# Patient Record
Sex: Female | Born: 1935 | Hispanic: No | Smoking: Never smoker
Health system: Southern US, Community
[De-identification: ages and names within clinical notes are randomized; demographics above are authoritative.]

## PROBLEM LIST (undated history)

## (undated) DIAGNOSIS — I10 Essential (primary) hypertension: Secondary | ICD-10-CM

## (undated) DIAGNOSIS — E785 Hyperlipidemia, unspecified: Secondary | ICD-10-CM

## (undated) HISTORY — PX: CHOLECYSTECTOMY: SHX55

---

## 2009-01-31 ENCOUNTER — Emergency Department (HOSPITAL_COMMUNITY): Admission: EM | Admit: 2009-01-31 | Discharge: 2009-01-31 | Payer: Self-pay | Admitting: Emergency Medicine

## 2009-02-26 ENCOUNTER — Emergency Department (HOSPITAL_COMMUNITY): Admission: EM | Admit: 2009-02-26 | Discharge: 2009-02-26 | Payer: Self-pay | Admitting: Emergency Medicine

## 2009-12-20 IMAGING — CR DG SHOULDER 2+V*R*
3 series · 3 of 3 positions shown · non-contrast
Comparison: None

CLINICAL DATA: Fell in shower this morning with right shoulder pain

RIGHT SHOULDER - 2+ VIEW

[t shoulder y view right *]
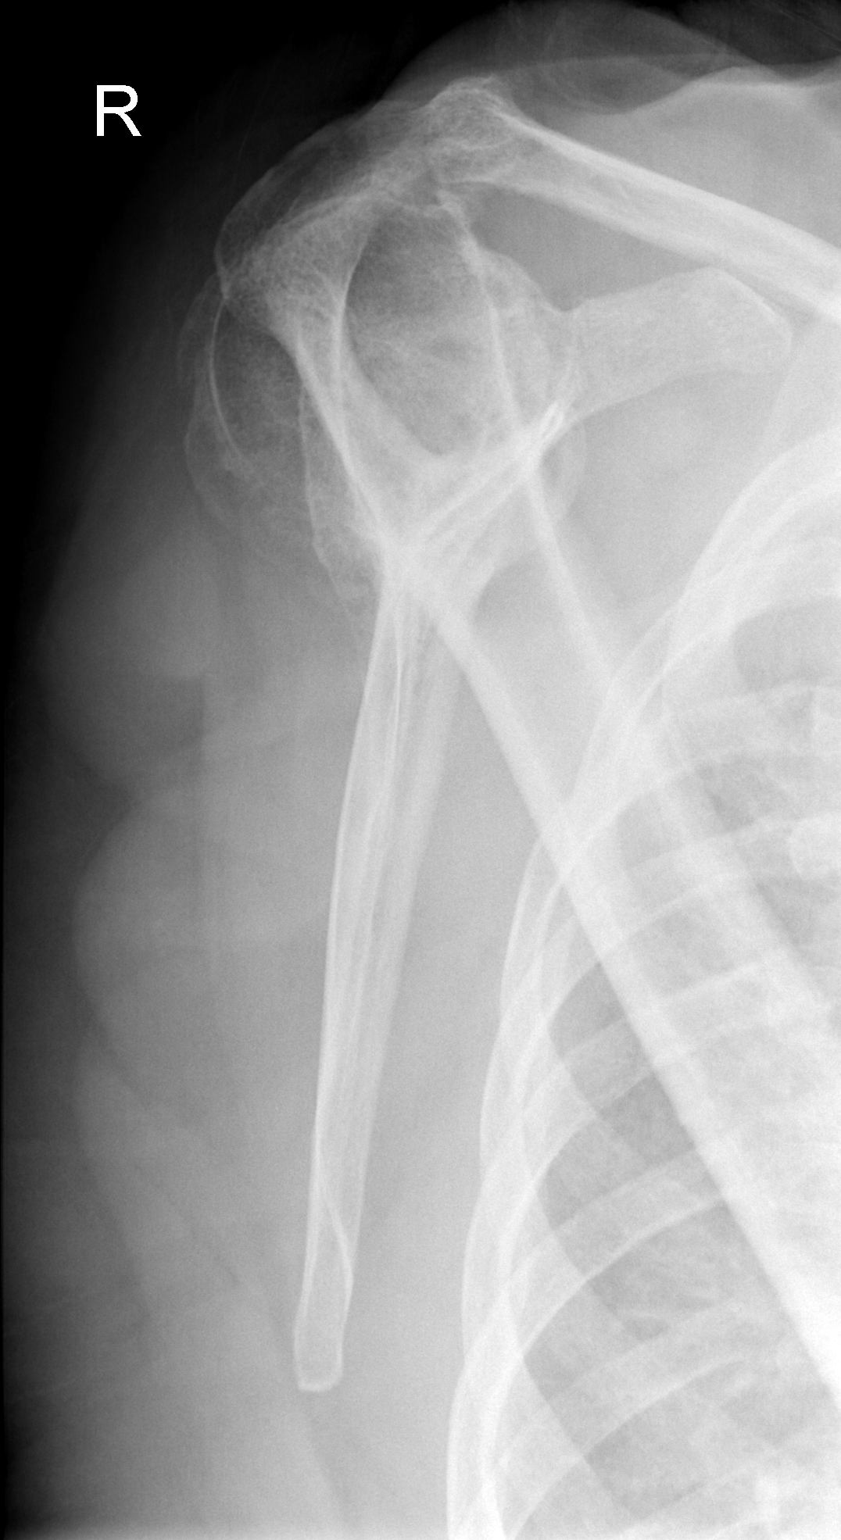

[t shoulder ap internal righ *]
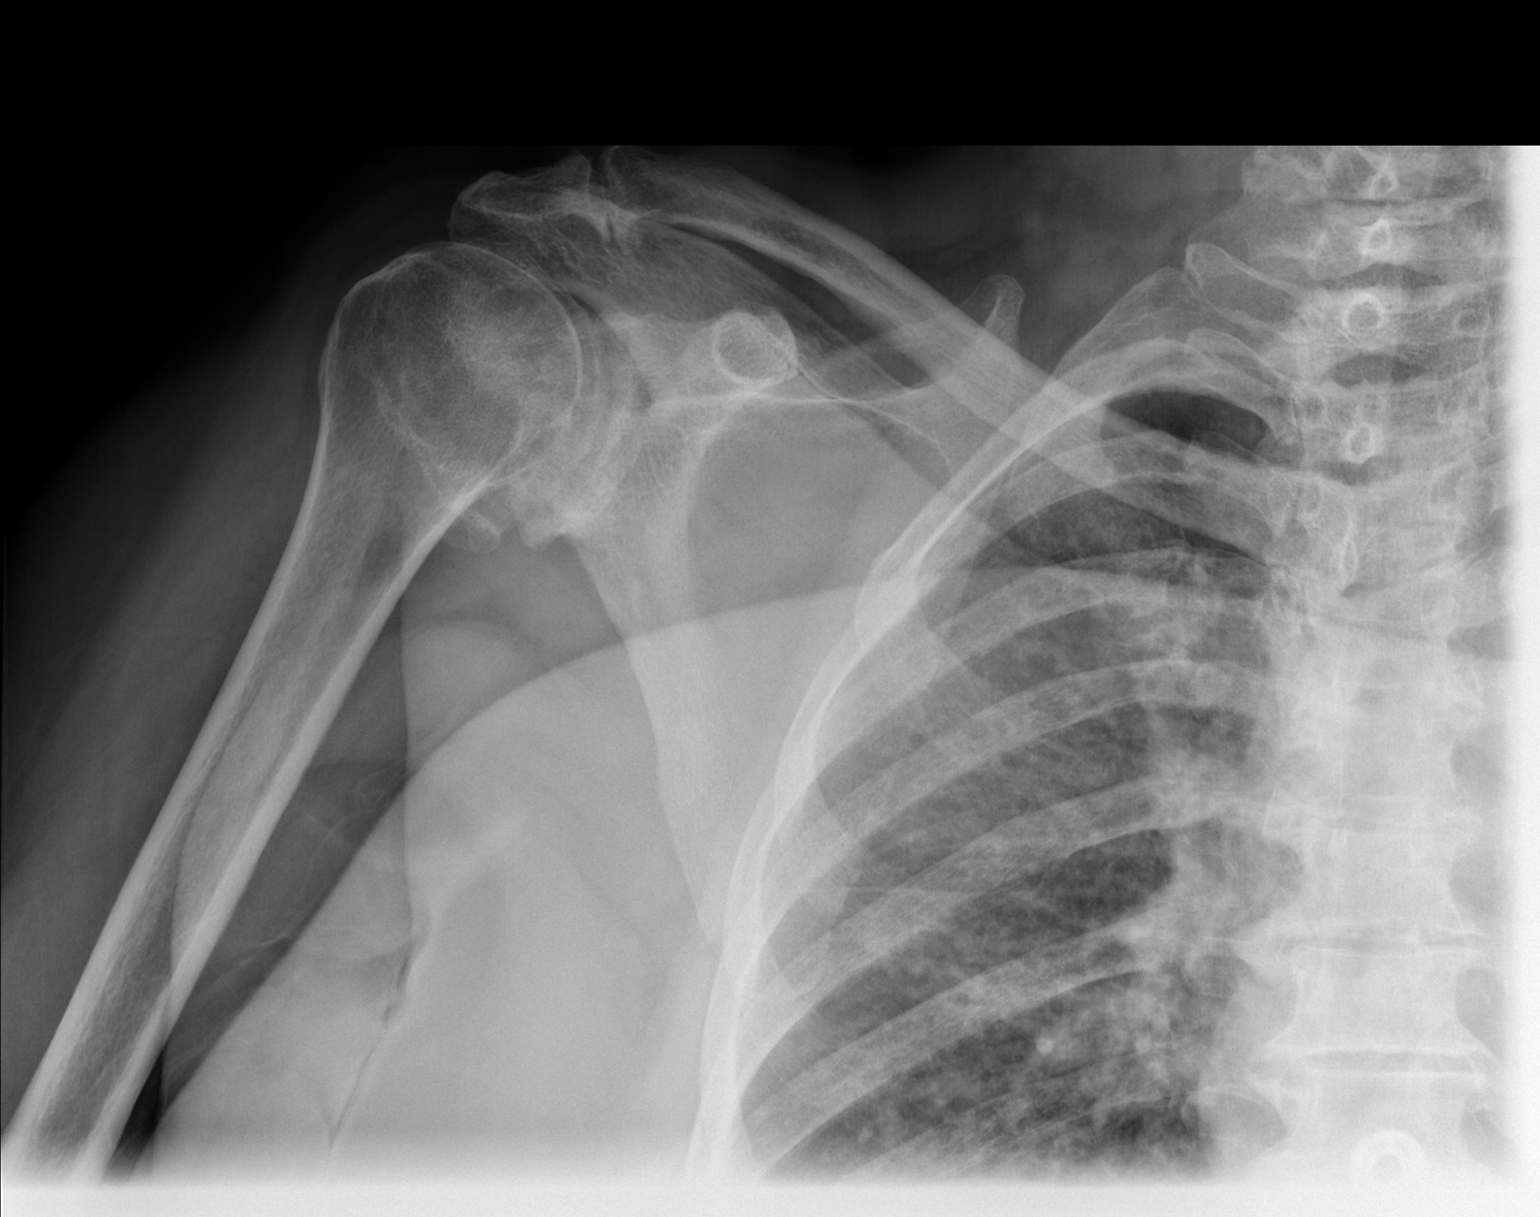

[t shoulder ap external righ]
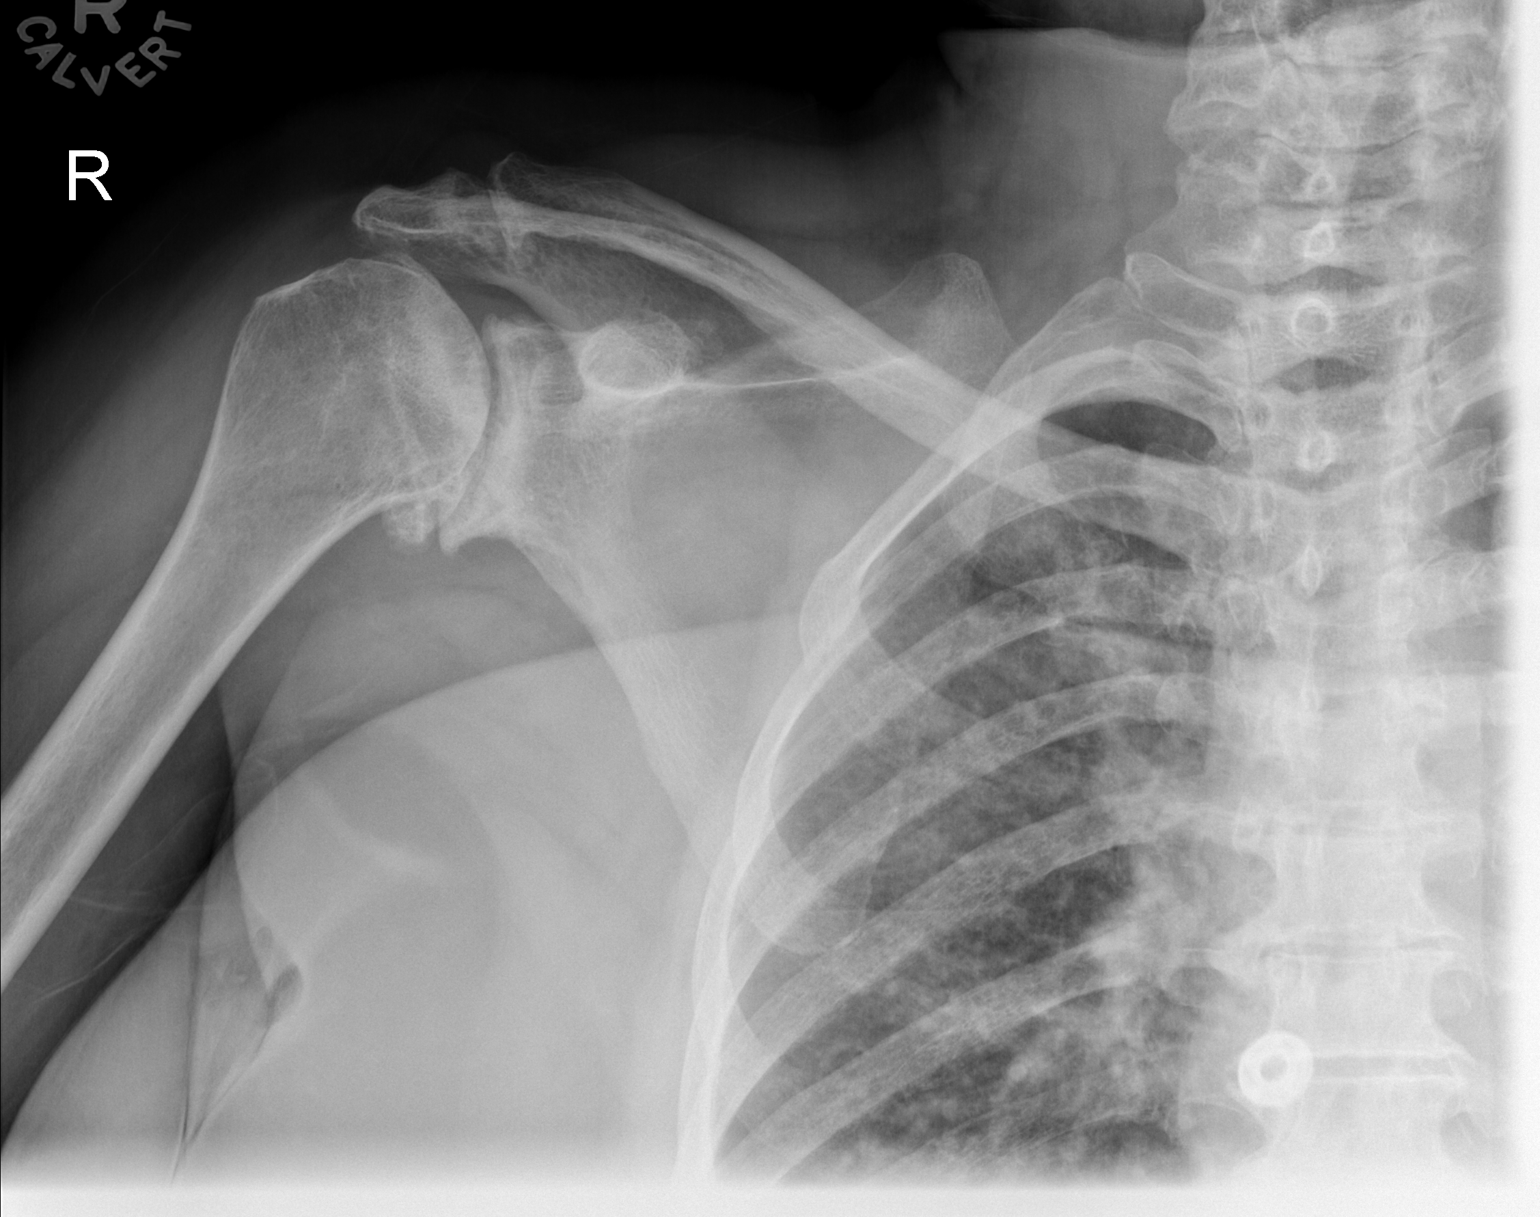

[3 of 3 positions shown; findings below may reference images not displayed]

FINDINGS: There is considerable degenerative change involving the
right glenohumeral joint space with loss of joint space, sclerosis,
and spurring.  No fracture is seen.  No dislocation is noted.  Mild
degenerative change involves the right AC joint.
IMPRESSION: Degenerative change primarily involving the right glenohumeral
joint.  No acute fracture.

## 2010-11-04 LAB — SEDIMENTATION RATE: Sed Rate: 10 mm/hr (ref 0–22)

## 2010-11-04 LAB — COMPREHENSIVE METABOLIC PANEL
ALT: 15 U/L (ref 0–35)
BUN: 10 mg/dL (ref 6–23)
CO2: 24 mEq/L (ref 19–32)
Calcium: 8.9 mg/dL (ref 8.4–10.5)
Creatinine, Ser: 0.77 mg/dL (ref 0.4–1.2)
GFR calc non Af Amer: >60 mL/min (ref >60)
Glucose, Bld: 102 mg/dL — ABNORMAL HIGH (ref 70–99)
Sodium: 140 mEq/L (ref 135–145)
Total Protein: 6.5 g/dL (ref 6.0–8.3)

## 2010-11-04 LAB — DIFFERENTIAL
Eosinophils Absolute: 0.1 10*3/uL (ref 0.0–0.7)
Lymphocytes Relative: 20 % (ref 12–46)
Lymphs Abs: 1.3 10*3/uL (ref 0.7–4.0)
Monocytes Relative: 6 % (ref 3–12)
Neutro Abs: 4.6 10*3/uL (ref 1.7–7.7)
Neutrophils Relative %: 73 % (ref 43–77)

## 2010-11-04 LAB — CBC
Hemoglobin: 14.1 g/dL (ref 12.0–15.0)
MCHC: 33.3 g/dL (ref 30.0–36.0)
MCV: 86.3 fL (ref 78.0–100.0)
RBC: 4.89 MIL/uL (ref 3.87–5.11)
RDW: 14.1 % (ref 11.5–15.5)

## 2013-09-18 ENCOUNTER — Encounter (HOSPITAL_COMMUNITY): Payer: Self-pay | Admitting: Emergency Medicine

## 2013-09-18 ENCOUNTER — Emergency Department (HOSPITAL_COMMUNITY)
Admission: EM | Admit: 2013-09-18 | Discharge: 2013-09-18 | Disposition: A | Discharge status: Home / Self Care | Payer: Self-pay | Attending: Emergency Medicine | Admitting: Emergency Medicine

## 2013-09-18 ENCOUNTER — Emergency Department (HOSPITAL_COMMUNITY): Payer: Self-pay

## 2013-09-18 DIAGNOSIS — J111 Influenza due to unidentified influenza virus with other respiratory manifestations: Secondary | ICD-10-CM

## 2013-09-18 DIAGNOSIS — R69 Illness, unspecified: Secondary | ICD-10-CM

## 2013-09-18 DIAGNOSIS — Z79899 Other long term (current) drug therapy: Secondary | ICD-10-CM | POA: Insufficient documentation

## 2013-09-18 DIAGNOSIS — I10 Essential (primary) hypertension: Secondary | ICD-10-CM | POA: Insufficient documentation

## 2013-09-18 DIAGNOSIS — E669 Obesity, unspecified: Secondary | ICD-10-CM | POA: Insufficient documentation

## 2013-09-18 DIAGNOSIS — Z7982 Long term (current) use of aspirin: Secondary | ICD-10-CM | POA: Insufficient documentation

## 2013-09-18 HISTORY — DX: Essential (primary) hypertension: I10

## 2013-09-18 HISTORY — DX: Hyperlipidemia, unspecified: E78.5

## 2013-09-18 LAB — I-STAT TROPONIN, ED: Troponin i, poc: 0.01 ng/mL (ref 0.00–0.08)

## 2013-09-18 LAB — BASIC METABOLIC PANEL
BUN: 13 mg/dL (ref 6–23)
CALCIUM: 9.4 mg/dL (ref 8.4–10.5)
CO2: 22 meq/L (ref 19–32)
CREATININE: 0.98 mg/dL (ref 0.50–1.10)
Chloride: 104 mEq/L (ref 96–112)
GFR calc Af Amer: 64 mL/min — ABNORMAL LOW (ref >90)
GFR calc non Af Amer: 55 mL/min — ABNORMAL LOW (ref >90)
Glucose, Bld: 101 mg/dL — ABNORMAL HIGH (ref 70–99)
Potassium: 3.9 mEq/L (ref 3.7–5.3)
Sodium: 139 mEq/L (ref 137–147)

## 2013-09-18 LAB — CBC WITH DIFFERENTIAL/PLATELET
BASOS ABS: 0 10*3/uL (ref 0.0–0.1)
BASOS PCT: 0 % (ref 0–1)
EOS PCT: 1 % (ref 0–5)
Eosinophils Absolute: 0.1 10*3/uL (ref 0.0–0.7)
HEMATOCRIT: 37.7 % (ref 36.0–46.0)
Hemoglobin: 12.6 g/dL (ref 12.0–15.0)
LYMPHS PCT: 10 % — AB (ref 12–46)
Lymphs Abs: 0.6 10*3/uL — ABNORMAL LOW (ref 0.7–4.0)
MCH: 28.2 pg (ref 26.0–34.0)
MCHC: 33.4 g/dL (ref 30.0–36.0)
MCV: 84.3 fL (ref 78.0–100.0)
MONO ABS: 0.7 10*3/uL (ref 0.1–1.0)
Monocytes Relative: 11 % (ref 3–12)
Neutro Abs: 5 10*3/uL (ref 1.7–7.7)
Neutrophils Relative %: 78 % — ABNORMAL HIGH (ref 43–77)
Platelets: 225 10*3/uL (ref 150–400)
RBC: 4.47 MIL/uL (ref 3.87–5.11)
RDW: 14.6 % (ref 11.5–15.5)
WBC: 6.4 10*3/uL (ref 4.0–10.5)

## 2013-09-18 LAB — RAPID STREP SCREEN (MED CTR MEBANE ONLY): STREPTOCOCCUS, GROUP A SCREEN (DIRECT): NEGATIVE

## 2013-09-18 MED ORDER — AEROCHAMBER PLUS FLO-VU MEDIUM MISC
1.0000 | Freq: Once | Status: AC
  Administered 2013-09-18: 1
  Filled 2013-09-18: qty 1

## 2013-09-18 MED ORDER — ALBUTEROL SULFATE HFA 108 (90 BASE) MCG/ACT IN AERS
2.0000 | INHALATION_SPRAY | Freq: Once | RESPIRATORY_TRACT | Status: AC
  Administered 2013-09-18: 2 via RESPIRATORY_TRACT
  Filled 2013-09-18: qty 6.7

## 2013-09-18 MED ORDER — OSELTAMIVIR PHOSPHATE 75 MG PO CAPS
75.0000 mg | ORAL_CAPSULE | Freq: Once | ORAL | Status: AC
  Administered 2013-09-18: 75 mg via ORAL
  Filled 2013-09-18: qty 1

## 2013-09-18 MED ORDER — IPRATROPIUM-ALBUTEROL 0.5-2.5 (3) MG/3ML IN SOLN
3.0000 mL | Freq: Once | RESPIRATORY_TRACT | Status: AC
Start: 2013-09-18 — End: 2013-09-18
  Administered 2013-09-18: 3 mL via RESPIRATORY_TRACT
  Filled 2013-09-18: qty 3

## 2013-09-18 MED ORDER — OSELTAMIVIR PHOSPHATE 75 MG PO CAPS: 75.0000 mg | ORAL_CAPSULE | Freq: Two times a day (BID) | ORAL | Status: AC

## 2013-09-18 NOTE — Discharge Instructions (Signed)
Influenza, Adult °Influenza (flu) is an infection in the mouth, nose, and throat (respiratory tract) caused by a virus. The flu can make you feel very ill. Influenza spreads easily from person to person (contagious).  °HOME CARE  °· Only take medicines as told by your doctor. °· Use a cool mist humidifier to make breathing easier. °· Get plenty of rest until your fever goes away. This usually takes 3 to 4 days. °· Drink enough fluids to keep your pee (urine) clear or pale yellow. °· Cover your mouth and nose when you cough or sneeze. °· Wash your hands well to avoid spreading the flu. °· Stay home from work or school until your fever has been gone for at least 1 full day. °· Get a flu shot every year. °GET HELP RIGHT AWAY IF:  °· You have trouble breathing or feel short of breath. °· Your skin or nails turn blue. °· You have severe neck pain or stiffness. °· You have a severe headache, facial pain, or earache. °· Your fever gets worse or keeps coming back. °· You feel sick to your stomach (nauseous), throw up (vomit), or have watery poop (diarrhea). °· You have chest pain. °· You have a deep cough that gets worse, or you cough up more thick spit (mucus). °MAKE SURE YOU:  °· Understand these instructions. °· Will watch your condition. °· Will get help right away if you are not doing well or get worse. °Document Released: 04/23/2008 Document Revised: 01/14/2012 Document Reviewed: 10/14/2011 °[employer]® Patient Information ©2014 [employer]. ° °

## 2013-09-18 NOTE — ED Provider Notes (Signed)
CSN: [id number]     Arrival date & time 09/18/13  1457 History   First MD Initiated Contact with Patient 09/18/13 1522     Chief Complaint  Patient presents with  . chest congestion   . Cough     (Consider location/radiation/quality/duration/timing/severity/associated sxs/prior Treatment) HPI 78 y.o. Female presents with complaints of cough and congestion.  She began having nasal congestion and sore throat yesterday with cough through the night.  The cough is nonproductive.  Denies fever or dyspnea.  She took nyquil and dayquil.  Last took otc meds 12 hour cough medicine and an antibiotic she brought from [place].  She did not have a flu shot this year.  She is here visiting her son from [place] and has been here for three months.  Her son has similar symptoms.  She is not a smoker and has never smoked.  She denies leg swelling, dvt, or history of pe.   Past Medical History  Diagnosis Date  . Hypertension   . Hyperlipidemia    Past Surgical History  Procedure Laterality Date  . Cholecystectomy     No family history on file. History  Substance Use Topics  . Smoking status: Never Smoker   . Smokeless tobacco: Not on file  . Alcohol Use: No   OB History   Grav Para Term Preterm Abortions TAB SAB Ect Mult Living                 Review of Systems  HENT: Positive for sneezing and sore throat.   Respiratory: Positive for cough.   Musculoskeletal:       H.o. Of frozen shoulder  All other systems reviewed and are negative.      Allergies  Review of patient's allergies indicates no known allergies.  Home Medications   Current Outpatient Rx  Name  Route  Sig  Dispense  Refill  . aspirin EC 81 MG tablet   Oral   Take 81 mg by mouth daily.         . cholecalciferol (VITAMIN D) 1000 UNITS tablet   Oral   Take 1,000 Units by mouth daily.         . vitamin B-12 (CYANOCOBALAMIN) 100 MCG tablet   Oral   Take 100 mcg by mouth daily.         . vitamin k 100 MCG  tablet   Oral   Take 100 mcg by mouth daily.          BP 154/66  Pulse 65  Temp(Src) 99.2 F (37.3 C) (Oral)  Resp 20  SpO2 96% Physical Exam  Nursing note and vitals reviewed. Constitutional: She is oriented to person, place, and time. She appears well-developed and well-nourished.  obese  HENT:  Head: Normocephalic and atraumatic.  Right Ear: External ear normal.  Left Ear: External ear normal.  Nose: Nose normal.  Eyes: Conjunctivae and EOM are normal. Pupils are equal, round, and reactive to light.  Neck: Normal range of motion. Neck supple.  Cardiovascular: Normal rate, regular rhythm and normal heart sounds.   Pulmonary/Chest: Effort normal. She has no wheezes. She has no rales. She exhibits no tenderness.  Decreased bs at left base  Abdominal: Soft. Bowel sounds are normal. There is no tenderness.  Musculoskeletal: Normal range of motion. She exhibits no edema and no tenderness.  Neurological: She is alert and oriented to person, place, and time. She has normal reflexes. No cranial nerve deficit. She exhibits normal muscle tone. Coordination  normal.  Skin: Skin is warm and dry. No rash noted.  Psychiatric: She has a normal mood and affect. Her behavior is normal. Judgment and thought content normal.    ED Course  Procedures (including critical care time) Labs Review Labs Reviewed  [facility] TROPOININ, ED   Imaging Review Dg Chest 2 View  09/18/2013   CLINICAL DATA:  3-4 day history of cough and chest congestion. Current history of hypertension.  EXAM: CHEST  2 VIEW  COMPARISON:  None.  FINDINGS: Suboptimal inspiration due to body habitus accounts for crowded bronchovascular markings, especially in the bases, and accentuates the cardiac silhouette. Taking this into account, cardiac silhouette upper normal in size to perhaps slightly enlarged for the AP technique. Thoracic aorta tortuous and atherosclerotic. Hilar and mediastinal contours otherwise unremarkable. Lungs  clear. Bronchovascular markings normal. Pulmonary vascularity normal. No visible pleural effusions. No pneumothorax. Degenerative changes throughout the thoracic spine. Embolization coils in the upper abdomen.  IMPRESSION: Suboptimal inspiration. No acute cardiopulmonary disease.   Electronically Signed   By: Hulan Saashomas  Lawrence M.D.   On: 09/18/2013 16:25    EKG Interpretation    Date/Time:  Saturday September 18 2013 15:06:58 EST Ventricular Rate:  68 PR Interval:  145 QRS Duration: 84 QT Interval:  394 QTC Calculation: 419 R Axis:   33 Text Interpretation:  Sinus rhythm Baseline wander in lead(s) V3 Confirmed by Ashey Tramontana MD, Tierrah Anastos ([id number]) on 09/18/2013 5:08:50 PM            MDM   Final diagnoses:  None   78 y.o. Female with complaints of uri no history of flu shot , she has cough but vs normal and cxr clear.  Plan albuterol mdi and tamiflu.  Discussed with patient and son and return precautions given.     Hilario Quarryanielle S Keedan Sample, MD 09/18/13 [phone]

## 2013-09-18 NOTE — ED Notes (Signed)
Per pt/family-cold symptoms/cough for 2 days-no relief with OTC meds-unable to sleep b/c of cough-c/o chronic back pain

## 2013-09-20 LAB — CULTURE, GROUP A STREP

## 2014-07-12 IMAGING — CR DG CHEST 2V
2 series · 2 of 2 positions shown · non-contrast
Comparison: None.

CLINICAL DATA: [REDACTED] history of cough and chest congestion.
Current history of hypertension.

EXAM:
CHEST  2 VIEW

[w chest lat]
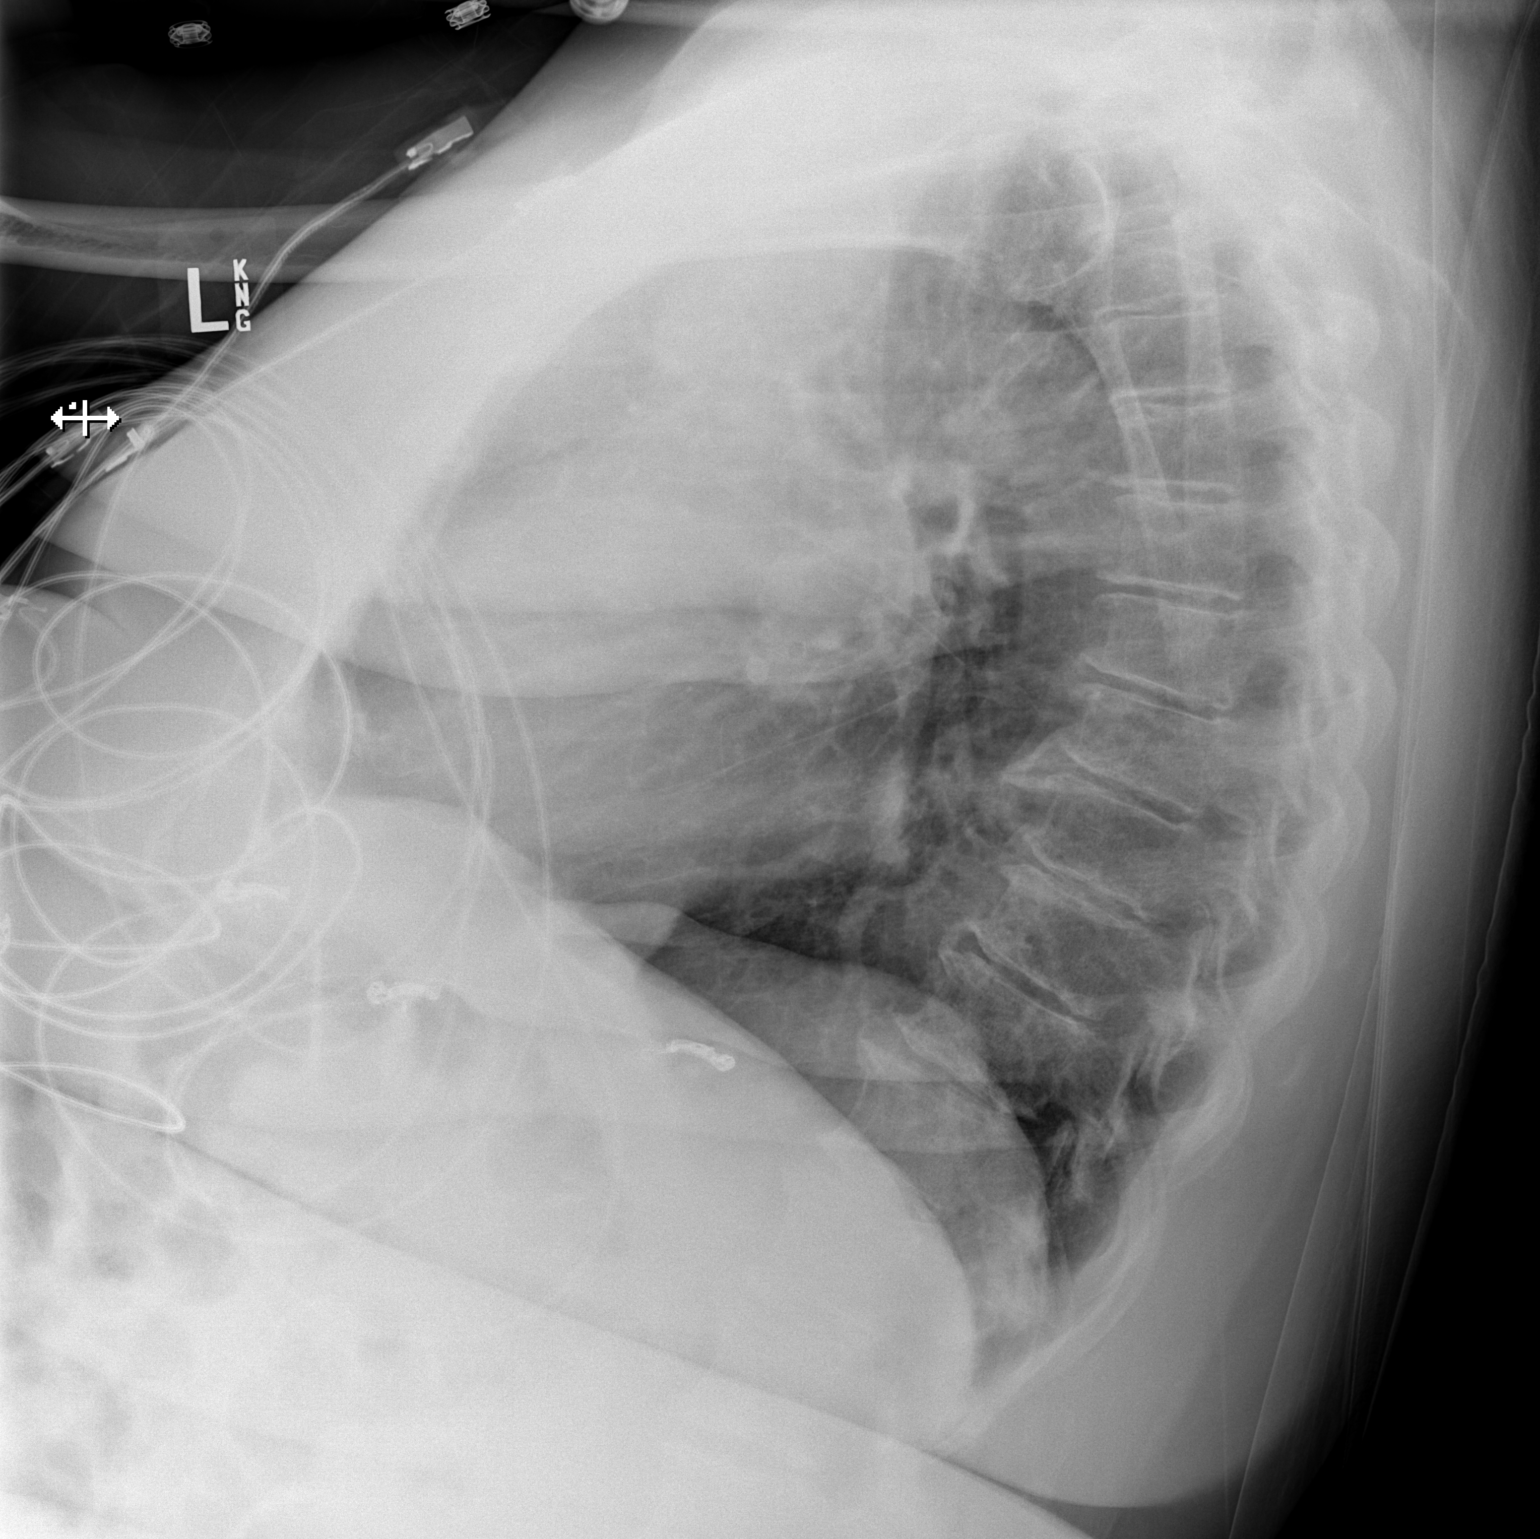

[x chest ap]
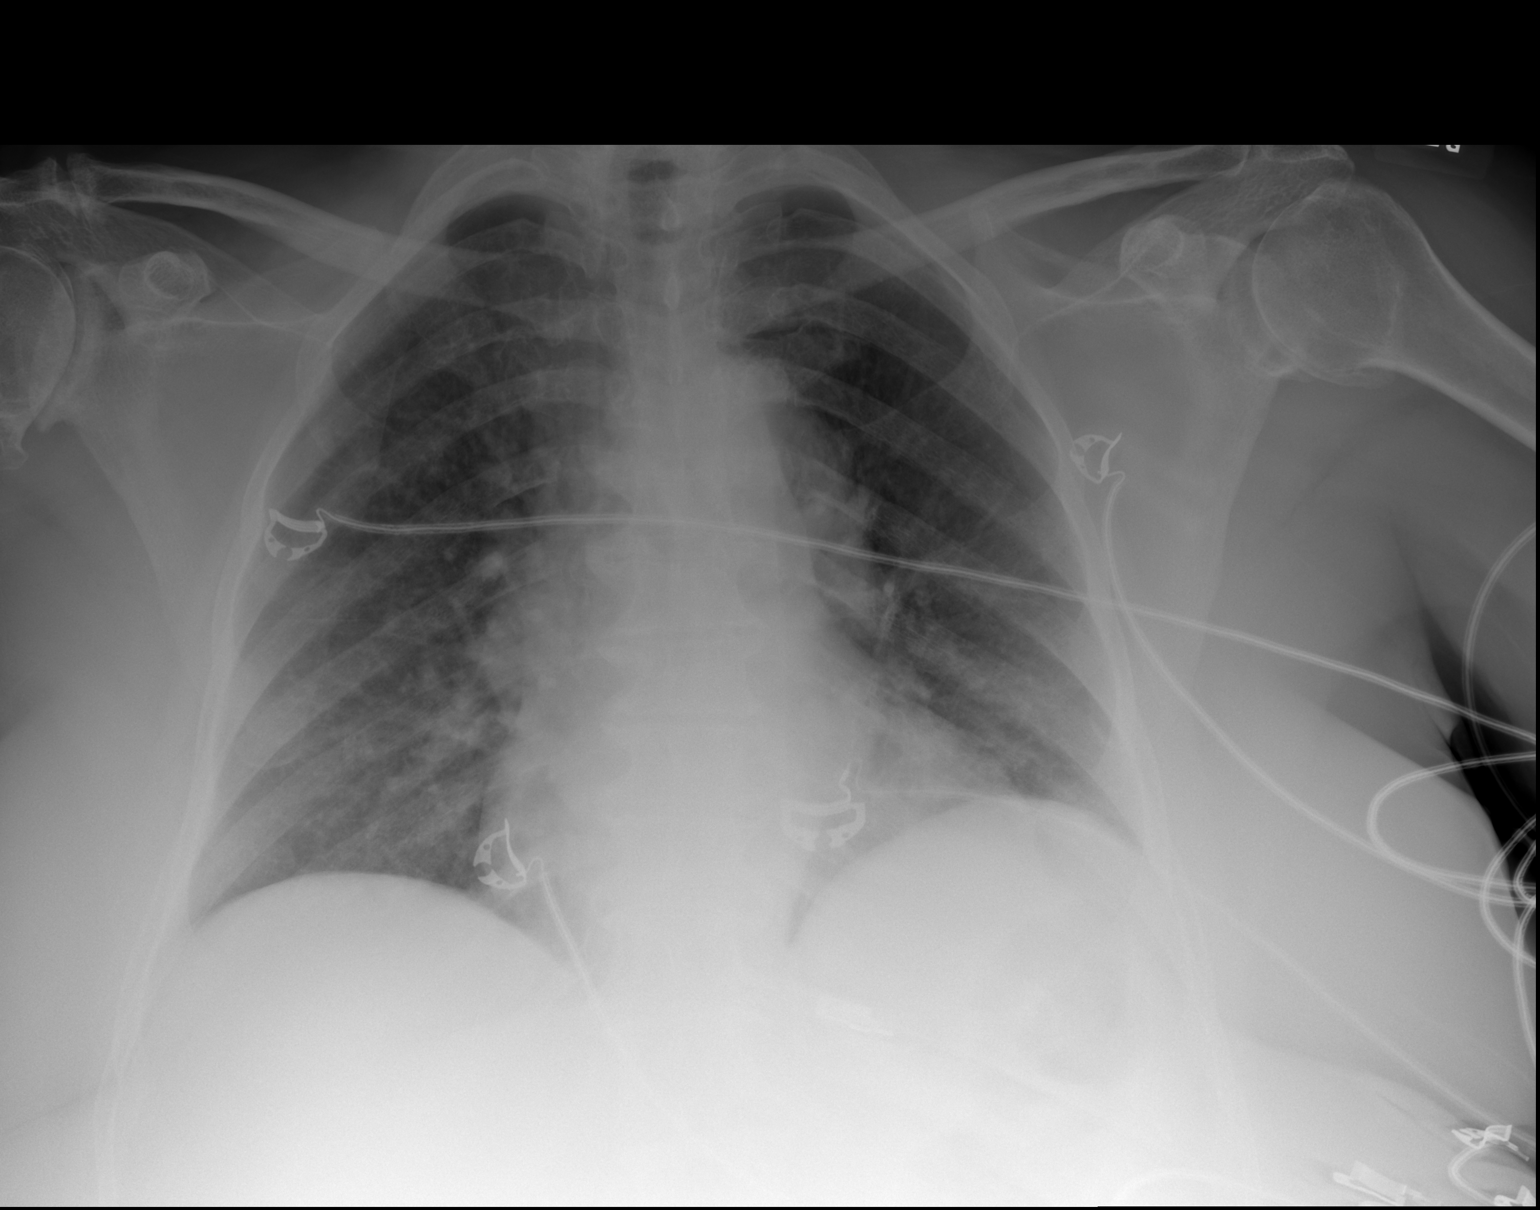

[2 of 2 positions shown; findings below may reference images not displayed]

FINDINGS: Suboptimal inspiration due to body habitus accounts for crowded
bronchovascular markings, especially in the bases, and accentuates
the cardiac silhouette. Taking this into account, cardiac silhouette
upper normal in size to perhaps slightly enlarged for the AP
technique. Thoracic aorta tortuous and atherosclerotic. Hilar and
mediastinal contours otherwise unremarkable. Lungs clear.
Bronchovascular markings normal. Pulmonary vascularity normal. No
visible pleural effusions. No pneumothorax. Degenerative changes
throughout the thoracic spine. Embolization coils in the upper
abdomen.
IMPRESSION: Suboptimal inspiration. No acute cardiopulmonary disease.
